# Patient Record
Sex: Female | Born: 2000
Health system: Southern US, Community
[De-identification: ages and names within clinical notes are randomized; demographics above are authoritative.]

## PROBLEM LIST (undated history)

## (undated) DIAGNOSIS — F32A Depression, unspecified: Secondary | ICD-10-CM

## (undated) DIAGNOSIS — F419 Anxiety disorder, unspecified: Secondary | ICD-10-CM

## (undated) DIAGNOSIS — F988 Other specified behavioral and emotional disorders with onset usually occurring in childhood and adolescence: Secondary | ICD-10-CM

## (undated) HISTORY — DX: Anxiety disorder, unspecified: F41.9

---
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
## 2022-02-01 ENCOUNTER — Ambulatory Visit (INDEPENDENT_AMBULATORY_CARE_PROVIDER_SITE_OTHER): Payer: BC Managed Care – PPO | Admitting: Adult Health

## 2022-02-01 ENCOUNTER — Encounter: Payer: Self-pay | Admitting: Adult Health

## 2022-02-01 DIAGNOSIS — F411 Generalized anxiety disorder: Secondary | ICD-10-CM

## 2022-02-01 DIAGNOSIS — F902 Attention-deficit hyperactivity disorder, combined type: Secondary | ICD-10-CM

## 2022-02-01 DIAGNOSIS — F324 Major depressive disorder, single episode, in partial remission: Secondary | ICD-10-CM | POA: Diagnosis not present

## 2022-02-01 MED ORDER — CLONIDINE HCL 0.1 MG PO TABS: 0.1000 mg | ORAL_TABLET | Freq: Every day | ORAL | 2 refills | Status: DC

## 2022-02-01 MED ORDER — VENLAFAXINE HCL ER 75 MG PO CP24: ORAL_CAPSULE | ORAL | 5 refills | Status: DC

## 2022-02-01 NOTE — Progress Notes (Signed)
Jenny Harrell [id number] 22-Jul-2000 21 y.o.  Subjective:   Patient ID:  Jenny Harrell is a 21 y.o. (DOB [birth year]) female.  Chief Complaint: No chief complaint on file.   HPI Kodi B Arruda presents to the office today for follow-up of ADHD, GAD and MDD.  Describes mood today as "ok". Pleasant. Reports tearfulness. Mood symptoms - denies depression, anxiety and irritability. Mood is consistent. Stating "I'm doing ok". Feels like the Effexor at 225mg  continues to work well for. Willing to consider other options for ADD - non stimulant. Stable interest and motivation. Taking medications as prescribed.  Energy levels lower. Active, does not have a regular exercise routine. Enjoys some usual interests and activities. Student. Lives between both parents - 1 - dog and 1- rabbit. Spending time with family and friends. Appetite adequate. Weight stable - 140 pounds. Sleeps well most nights. Averages 6 to 7 hours. Focus and concentration difficulties. Completing tasks. Managing aspects of household. Taking a semester off from [employer]. Works at [employer]. Denies SI or HI.  Denies AH or VH.  Previous medication trials: Denies   Flowsheet Row ED from 02/21/2021 in [facility] from 09/18/2020 in [facility]  C-SSRS RISK CATEGORY No Risk No Risk        Review of Systems:  Review of Systems  Musculoskeletal:  Negative for gait problem.  Neurological:  Negative for tremors.  Psychiatric/Behavioral:         Please refer to HPI    Medications: I have reviewed the patient's current medications.  Current Outpatient Medications  Medication Sig Dispense Refill   amphetamine-dextroamphetamine (ADDERALL XR) 15 MG 24 hr capsule TAKE ONE CAPSULE BY MOUTH EVERY DAY AFTER BREAKFAST 30 capsule 0   ibuprofen (ADVIL) 600 MG tablet Take 1 tablet (600 mg total) by mouth every 6 (six) hours as needed. 30 tablet 0   venlafaxine  XR (EFFEXOR-XR) 75 MG 24 hr capsule Take three capsules every morning. 90 capsule 5   No current facility-administered medications for this visit.    Medication Side Effects: None  Allergies: No Known Allergies  Past Medical History:  Diagnosis Date   ADD (attention deficit disorder)    Depression     Past Medical History, Surgical history, Social history, and Family history were reviewed and updated as appropriate.   Please see review of systems for further details on the patient's review from today.   Objective:   Physical Exam:  There were no vitals taken for this visit.  Physical Exam Constitutional:      General: She is not in acute distress. Musculoskeletal:        General: No deformity.  Neurological:     Mental Status: She is alert and oriented to person, place, and time.     Coordination: Coordination normal.  Psychiatric:        Attention and Perception: Attention and perception normal. She does not perceive auditory or visual hallucinations.        Mood and Affect: Mood normal. Mood is not anxious or depressed. Affect is not labile, blunt, angry or inappropriate.        Speech: Speech normal.        Behavior: Behavior normal.        Thought Content: Thought content normal. Thought content is not paranoid or delusional. Thought content does not include homicidal or suicidal ideation. Thought content does not include homicidal or suicidal plan.  Cognition and Memory: Cognition and memory normal.        Judgment: Judgment normal.     Comments: Insight intact     Lab Review:     Component Value Date/Time   NA 136 05/24/2018 1953   K 3.7 05/24/2018 1953   CL 105 05/24/2018 1953   CO2 25 05/24/2018 1953   GLUCOSE 89 05/24/2018 1953   BUN 14 05/24/2018 1953   CREATININE 0.70 05/24/2018 1953   CALCIUM 9.4 05/24/2018 1953   GFRNONAA NOT CALCULATED 05/24/2018 1953   GFRAA NOT CALCULATED 05/24/2018 1953       Component Value Date/Time   WBC 7.3  05/24/2018 1953   RBC 4.26 05/24/2018 1953   HGB 12.4 05/24/2018 1953   HCT 38.6 05/24/2018 1953   PLT 285 05/24/2018 1953   MCV 90.6 05/24/2018 1953   MCH 29.1 05/24/2018 1953   MCHC 32.1 05/24/2018 1953   RDW 13.0 05/24/2018 1953    No results found for: "POCLITH", "LITHIUM"   No results found for: "PHENYTOIN", "PHENOBARB", "VALPROATE", "CBMZ"   .res Assessment: Plan:    Plan:  PDMP reviewed  Effexor XR 225mg  every morning Add Clonidine 0.1mg  at hs for ADD  Has genetic testing results and will try to provide next visit.   RTC 3 months      Time spent with patient was 25 minutes. Greater than 50% of face to face time with patient was spent on counseling and coordination of care.    Patient advised to contact office with any questions, adverse effects, or acute worsening in signs and symptoms.  Discussed potential benefits, risks, and side effects of stimulants with patient to include increased heart rate, palpitations, insomnia, increased anxiety, increased irritability, or decreased appetite.  Instructed patient to contact office if experiencing any significant tolerability issues.  There are no diagnoses linked to this encounter.   Please see After Visit Summary for patient specific instructions.  No future appointments.  No orders of the defined types were placed in this encounter.   -------------------------------

## 2022-02-02 DIAGNOSIS — Z30013 Encounter for initial prescription of injectable contraceptive: Secondary | ICD-10-CM | POA: Diagnosis not present

## 2022-03-01 ENCOUNTER — Ambulatory Visit: Payer: BC Managed Care – PPO | Admitting: Adult Health

## 2022-03-15 ENCOUNTER — Ambulatory Visit (INDEPENDENT_AMBULATORY_CARE_PROVIDER_SITE_OTHER): Payer: BC Managed Care – PPO | Admitting: Adult Health

## 2022-03-15 ENCOUNTER — Encounter: Payer: Self-pay | Admitting: Adult Health

## 2022-03-15 DIAGNOSIS — F902 Attention-deficit hyperactivity disorder, combined type: Secondary | ICD-10-CM | POA: Diagnosis not present

## 2022-03-15 DIAGNOSIS — F324 Major depressive disorder, single episode, in partial remission: Secondary | ICD-10-CM

## 2022-03-15 DIAGNOSIS — F411 Generalized anxiety disorder: Secondary | ICD-10-CM

## 2022-03-15 DIAGNOSIS — L700 Acne vulgaris: Secondary | ICD-10-CM | POA: Insufficient documentation

## 2022-03-15 MED ORDER — METHYLPHENIDATE HCL 10 MG PO TABS: 10.0000 mg | ORAL_TABLET | Freq: Every day | ORAL | 0 refills | Status: DC

## 2022-03-15 NOTE — Progress Notes (Signed)
Jenny Harrell [id number] [birth year] 20 y.o.  Subjective:   Patient ID:  Jenny Harrell is a 21 y.o. (DOB 14-May-2001) female.  Chief Complaint: No chief complaint on file.   HPI Jenny Harrell presents to the office today for follow-up of ADHD, GAD and MDD.  Describes mood today as "ok". Pleasant. Reports tearfulness. Mood symptoms - denies depression, anxiety and irritability. Mood is consistent. Stating "I'm doing ok". Feels like the Effexor at 225mg  continues to work well for. Did not tolerate the Clonidine and has stopped - "I felt very numb and awkward on it". Willing to consider other options. Stable interest and motivation. Taking medications as prescribed.  Energy levels normal. Active, does not have a regular exercise routine. Enjoys some usual interests and activities. Student. Has a boyfriend. Lives between both parents - 1 - dog and 1- rabbit. Spending time with family and friends. Appetite adequate. Weight stable - 140 pounds. Sleeps well most nights. Averages 6 to 7 hours. Focus and concentration difficulties. Completing tasks. Managing aspects of household. Taking a semester off from [employer]. Works at [employer]. Denies SI or HI.  Denies AH or VH. Denies self harm. Denies substance use.   Previous medication trials: Denies  Flowsheet Row ED from [facility]/21/2021 in [facility] from 09/18/2020 in [facility]  C-SSRS RISK CATEGORY No Risk No Risk        Review of Systems:  Review of Systems  Musculoskeletal:  Negative for gait problem.  Neurological:  Negative for tremors.  Psychiatric/Behavioral:         Please refer to HPI    Medications: I have reviewed the patient's current medications.  Current Outpatient Medications  Medication Sig Dispense Refill   methylphenidate (RITALIN) 10 MG tablet Take 1 tablet (10 mg total) by mouth daily. 30 tablet 0   ibuprofen (ADVIL) 600 MG tablet Take 1  tablet (600 mg total) by mouth every 6 (six) hours as needed. 30 tablet 0   venlafaxine XR (EFFEXOR-XR) 75 MG 24 hr capsule Take three capsules every morning. 90 capsule 5   No current facility-administered medications for this visit.    Medication Side Effects: None  Allergies: No Known Allergies  Past Medical History:  Diagnosis Date   ADD (attention deficit disorder)    Depression     Past Medical History, Surgical history, Social history, and Family history were reviewed and updated as appropriate.   Please see review of systems for further details on the patient's review from today.   Objective:   Physical Exam:  There were no vitals taken for this visit.  Physical Exam Constitutional:      General: She is not in acute distress. Musculoskeletal:        General: No deformity.  Neurological:     Mental Status: She is alert and oriented to person, place, and time.     Coordination: Coordination normal.  Psychiatric:        Attention and Perception: Attention and perception normal. She does not perceive auditory or visual hallucinations.        Mood and Affect: Mood normal. Mood is not anxious or depressed. Affect is not labile, blunt, angry or inappropriate.        Speech: Speech normal.        Behavior: Behavior normal.        Thought Content: Thought content normal. Thought content is not paranoid or delusional. Thought content does not include homicidal  or suicidal ideation. Thought content does not include homicidal or suicidal plan.        Cognition and Memory: Cognition and memory normal.        Judgment: Judgment normal.     Comments: Insight intact     Lab Review:     Component Value Date/Time   NA 136 05/24/2018 1953   K 3.7 05/24/2018 1953   CL 105 05/24/2018 1953   CO2 25 05/24/2018 1953   GLUCOSE 89 05/24/2018 1953   BUN 14 05/24/2018 1953   CREATININE 0.70 05/24/2018 1953   CALCIUM 9.4 05/24/2018 1953   GFRNONAA NOT CALCULATED 05/24/2018 1953    GFRAA NOT CALCULATED 05/24/2018 1953       Component Value Date/Time   WBC 7.3 05/24/2018 1953   RBC 4.26 05/24/2018 1953   HGB 12.4 05/24/2018 1953   HCT 38.6 05/24/2018 1953   PLT 285 05/24/2018 1953   MCV 90.6 05/24/2018 1953   [place] 29.1 05/24/2018 1953   MCHC 32.1 05/24/2018 1953   RDW 13.0 05/24/2018 1953    No results found for: "POCLITH", "LITHIUM"   No results found for: "PHENYTOIN", "PHENOBARB", "VALPROATE", "CBMZ"   .res Assessment: Plan:    Plan:  PDMP reviewed  Effexor XR 225mg  every morning  Add Ritalin 10mg  daily  D/C Clonidine 0.1mg  at hs for ADD  Has genetic testing results on phone  120/85 88  RTC 3 months    Time spent with patient was 25 minutes. Greater than 50% of face to face time with patient was spent on counseling and coordination of care.    Patient advised to contact office with any questions, adverse effects, or acute worsening in signs and symptoms.  Discussed potential benefits, risks, and side effects of stimulants with patient to include increased heart rate, palpitations, insomnia, increased anxiety, increased irritability, or decreased appetite.  Instructed patient to contact office if experiencing any significant tolerability issues.  Diagnoses and all orders for this visit:  Attention deficit hyperactivity disorder (ADHD), combined type, moderate -     methylphenidate (RITALIN) 10 MG tablet; Take 1 tablet (10 mg total) by mouth daily.  GAD (generalized anxiety disorder)  Major depression single episode, in partial remission ([place])     Please see After Visit Summary for patient specific instructions.  Future Appointments  Date Time Provider [place]  04/29/2022  3:00 PM Princess Bruins, MD [facility] None  06/15/2022 11:40 AM Deklin Bieler, Berdie Ogren, NP [facility] None    No orders of the defined types were placed in this encounter.   -------------------------------

## 2022-03-22 DIAGNOSIS — Z0000 Encounter for general adult medical examination without abnormal findings: Secondary | ICD-10-CM | POA: Diagnosis not present

## 2022-03-22 DIAGNOSIS — Z113 Encounter for screening for infections with a predominantly sexual mode of transmission: Secondary | ICD-10-CM | POA: Diagnosis not present

## 2022-03-22 DIAGNOSIS — Z1331 Encounter for screening for depression: Secondary | ICD-10-CM | POA: Diagnosis not present

## 2022-03-22 DIAGNOSIS — Z68.41 Body mass index (BMI) pediatric, 5th percentile to less than 85th percentile for age: Secondary | ICD-10-CM | POA: Diagnosis not present

## 2022-03-22 DIAGNOSIS — Z713 Dietary counseling and surveillance: Secondary | ICD-10-CM | POA: Diagnosis not present

## 2022-04-27 DIAGNOSIS — Z30013 Encounter for initial prescription of injectable contraceptive: Secondary | ICD-10-CM | POA: Diagnosis not present

## 2022-04-29 ENCOUNTER — Encounter: Payer: Self-pay | Admitting: Obstetrics & Gynecology

## 2022-06-15 ENCOUNTER — Encounter: Payer: Self-pay | Admitting: Adult Health

## 2022-06-15 ENCOUNTER — Ambulatory Visit (INDEPENDENT_AMBULATORY_CARE_PROVIDER_SITE_OTHER): Payer: BC Managed Care – PPO | Admitting: Adult Health

## 2022-06-15 DIAGNOSIS — F902 Attention-deficit hyperactivity disorder, combined type: Secondary | ICD-10-CM

## 2022-06-15 DIAGNOSIS — F324 Major depressive disorder, single episode, in partial remission: Secondary | ICD-10-CM | POA: Diagnosis not present

## 2022-06-15 DIAGNOSIS — F411 Generalized anxiety disorder: Secondary | ICD-10-CM

## 2022-06-15 MED ORDER — METHYLPHENIDATE HCL 10 MG PO TABS: 10.0000 mg | ORAL_TABLET | Freq: Every day | ORAL | 0 refills | Status: DC

## 2022-06-15 NOTE — Progress Notes (Signed)
Jenny Harrell [id number] Oct 16, 2000 22 y.o.  Subjective:   Patient ID:  Jenny Harrell is a 22 y.o. (DOB 01-12-2001) female.  Chief Complaint: No chief complaint on file.   HPI Jenny Harrell presents to the office today for follow-up of ADHD, GAD and MDD.  Describes mood today as "ok". Pleasant. Reports tearfulness. Mood symptoms - decreased depression, anxiety and irritability. Mood is consistent. Stating "I'm doing alright". Feels like current medication regimen works well. Stable interest and motivation. Taking medications as prescribed.  Energy levels normal. Active, does not have a regular exercise routine. Enjoys some usual interests and activities. Student. Has a boyfriend. Lives between both parents - 1 - dog and 1- rabbit. Spending time with family and friends. Appetite adequate. Weight stable - 140 pounds. Sleeps well most nights. Averages 6 to 7 hours. Focus and concentration "better" with addition of Ritalin. Completing tasks. Managing aspects of household. Taking a semester off from [employer]. Works at [employer]. Denies SI or HI.  Denies AH or VH. Denies self harm. Denies substance use.   Previous medication trials: Denies   [place] ED from 02/21/2021 in [facility] Department at [facility] ED from 09/18/2020 in [facility] Department at [place] No Risk No Risk        Review of Systems:  Review of Systems  Musculoskeletal:  Negative for gait problem.  Neurological:  Negative for tremors.  Psychiatric/Behavioral:         Please refer to HPI    Medications: I have reviewed the patient's current medications.  Current Outpatient Medications  Medication Sig Dispense Refill   ibuprofen (ADVIL) 600 MG tablet Take 1 tablet (600 mg total) by mouth every 6 (six) hours as needed. 30 tablet 0   methylphenidate (RITALIN) 10 MG tablet Take 1 tablet (10 mg total) by mouth daily. 30 tablet  0   venlafaxine XR (EFFEXOR-XR) 75 MG 24 hr capsule Take three capsules every morning. 90 capsule 5   No current facility-administered medications for this visit.    Medication Side Effects: None  Allergies: No Known Allergies  Past Medical History:  Diagnosis Date   ADD (attention deficit disorder)    Depression     Past Medical History, Surgical history, Social history, and Family history were reviewed and updated as appropriate.   Please see review of systems for further details on the patient's review from today.   Objective:   Physical Exam:  There were no vitals taken for this visit.  Physical Exam Constitutional:      General: She is not in acute distress. Musculoskeletal:        General: No deformity.  Neurological:     Mental Status: She is alert and oriented to person, place, and time.     Coordination: Coordination normal.  Psychiatric:        Attention and Perception: Attention and perception normal. She does not perceive auditory or visual hallucinations.        Mood and Affect: Mood normal. Mood is not anxious or depressed. Affect is not labile, blunt, angry or inappropriate.        Speech: Speech normal.        Behavior: Behavior normal.        Thought Content: Thought content normal. Thought content is not paranoid or delusional. Thought content does not include homicidal or suicidal ideation. Thought content does not include homicidal or suicidal plan.  Cognition and Memory: Cognition and memory normal.        Judgment: Judgment normal.     Comments: Insight intact     Lab Review:     Component Value Date/Time   NA 136 05/24/2018 1953   K 3.7 05/24/2018 1953   CL 105 05/24/2018 1953   CO2 25 05/24/2018 1953   GLUCOSE 89 05/24/2018 1953   BUN 14 05/24/2018 1953   CREATININE 0.70 05/24/2018 1953   CALCIUM 9.4 05/24/2018 1953   GFRNONAA NOT CALCULATED 05/24/2018 1953   GFRAA NOT CALCULATED 05/24/2018 1953       Component Value Date/Time    WBC 7.3 05/24/2018 1953   RBC 4.26 05/24/2018 1953   HGB 12.4 05/24/2018 1953   HCT 38.6 05/24/2018 1953   PLT 285 05/24/2018 1953   MCV 90.6 05/24/2018 1953   [place] 29.1 05/24/2018 1953   MCHC 32.1 05/24/2018 1953   RDW 13.0 05/24/2018 1953    No results found for: "POCLITH", "LITHIUM"   No results found for: "PHENYTOIN", "PHENOBARB", "VALPROATE", "CBMZ"   .res Assessment: Plan:    Plan:  PDMP reviewed  Effexor XR 225mg  every morning Ritalin 10mg  daily  Has genetic testing results on phone  120/85 88  RTC 3 months    Time spent with patient was 25 minutes. Greater than 50% of face to face time with patient was spent on counseling and coordination of care.    Patient advised to contact office with any questions, adverse effects, or acute worsening in signs and symptoms.  Discussed potential benefits, risks, and side effects of stimulants with patient to include increased heart rate, palpitations, insomnia, increased anxiety, increased irritability, or decreased appetite.  Instructed patient to contact office if experiencing any significant tolerability issues.  Diagnoses and all orders for this visit:  Attention deficit hyperactivity disorder (ADHD), combined type, moderate -     methylphenidate (RITALIN) 10 MG tablet; Take 1 tablet (10 mg total) by mouth daily.     Please see After Visit Summary for patient specific instructions.  Future Appointments  Date Time Provider [place]  06/17/2022 11:00 AM Princess Bruins, MD [facility] None    No orders of the defined types were placed in this encounter.   -------------------------------

## 2022-06-16 DIAGNOSIS — M545 Low back pain, unspecified: Secondary | ICD-10-CM | POA: Diagnosis not present

## 2022-06-17 ENCOUNTER — Encounter: Payer: Self-pay | Admitting: Obstetrics & Gynecology

## 2022-06-17 ENCOUNTER — Ambulatory Visit (INDEPENDENT_AMBULATORY_CARE_PROVIDER_SITE_OTHER): Payer: BC Managed Care – PPO | Admitting: Obstetrics & Gynecology

## 2022-06-17 VITALS — BP 116/78 | HR 85 | Ht 65.25 in | Wt 137.0 lb

## 2022-06-17 DIAGNOSIS — Z3042 Encounter for surveillance of injectable contraceptive: Secondary | ICD-10-CM

## 2022-06-17 DIAGNOSIS — Z01419 Encounter for gynecological examination (general) (routine) without abnormal findings: Secondary | ICD-10-CM | POA: Diagnosis not present

## 2022-06-17 DIAGNOSIS — Z113 Encounter for screening for infections with a predominantly sexual mode of transmission: Secondary | ICD-10-CM | POA: Diagnosis not present

## 2022-06-17 NOTE — Progress Notes (Signed)
Jenny Harrell [birth year] [id number]   History:    22 y.o. G0 Boyfriend  RP:  New patient presenting for annual gyn exam   HPI: Well on DepoProvera x 3 years.  Next dose due 07/2022.  No BTB.  No pelvic pain.  No pain with IC.  No H/O STI.  Gono-Chlam done today.  HPV vaccine received already.  Breasts normal.  BMI 22.62.    Past medical history,surgical history, family history and social history were all reviewed and documented in the EPIC chart.  Gynecologic History No LMP recorded. Patient has had an injection.  Obstetric History OB History  Gravida Para Term Preterm AB Living  0 0 0 0 0 0  SAB IAB Ectopic Multiple Live Births  0 0 0 0 0     ROS: A ROS was performed and pertinent positives and negatives are included in the history. GENERAL: No fevers or chills. HEENT: No change in vision, no earache, sore throat or sinus congestion. NECK: No pain or stiffness. CARDIOVASCULAR: No chest pain or pressure. No palpitations. PULMONARY: No shortness of breath, cough or wheeze. GASTROINTESTINAL: No abdominal pain, nausea, vomiting or diarrhea, melena or bright red blood per rectum. GENITOURINARY: No urinary frequency, urgency, hesitancy or dysuria. MUSCULOSKELETAL: No joint or muscle pain, no back pain, no recent trauma. DERMATOLOGIC: No rash, no itching, no lesions. ENDOCRINE: No polyuria, polydipsia, no heat or cold intolerance. No recent change in weight. HEMATOLOGICAL: No anemia or easy bruising or bleeding. NEUROLOGIC: No headache, seizures, numbness, tingling or weakness. PSYCHIATRIC: No depression, no loss of interest in normal activity or change in sleep pattern.     Exam:   BP 116/78   Pulse 85   Ht 5' 5.25" (1.657 m)   Wt 137 lb (62.1 kg)   SpO2 97%   BMI 22.62 kg/m   Body mass index is 22.62 kg/m.  General appearance : Well developed well nourished female. No acute distress HEENT: Eyes: no retinal hemorrhage or exudates,  Neck supple, trachea midline, no carotid  bruits, no thyroidmegaly Lungs: Clear to auscultation, no rhonchi or wheezes, or rib retractions  Heart: Regular rate and rhythm, no murmurs or gallops Breast:Examined in sitting and supine position were symmetrical in appearance, no palpable masses or tenderness,  no skin retraction, no nipple inversion, no nipple discharge, no skin discoloration, no axillary or supraclavicular lymphadenopathy Abdomen: no palpable masses or tenderness, no rebound or guarding Extremities: no edema or skin discoloration or tenderness  Pelvic: Vulva: Normal             Vagina: No gross lesions or discharge  Cervix: No gross lesions or discharge.  Gono-Chlam done.  Uterus  AV, normal size, shape and consistency, non-tender and mobile  Adnexa  Without masses or tenderness  Anus: Normal   Assessment/Plan:  22 y.o. female for annual exam   1. Well female exam with routine gynecological exam Well on DepoProvera x 3 years.  Next dose due 07/2022.  No BTB.  No pelvic pain.  No pain with IC.  No H/O STI.  Gono-Chlam done today.  HPV vaccine received already.  Breasts normal.  BMI 22.62.    2. Screen for STD (sexually transmitted disease) Declines other STI screen. - C. trachomatis/N. gonorrhoeae RNA  3. Encounter for surveillance of injectable contraceptive Well on DepoProvera x 3 years.  Next dose at [facility] due in 07/2022.  Other orders - medroxyPROGESTERone (DEPO-PROVERA) 150 MG/ML injection; Inject into the muscle as directed.  Princess Bruins MD, 11:18 AM

## 2022-06-18 DIAGNOSIS — H3589 Other specified retinal disorders: Secondary | ICD-10-CM | POA: Diagnosis not present

## 2022-06-18 LAB — C. TRACHOMATIS/N. GONORRHOEAE RNA
C. trachomatis RNA, TMA: NOT DETECTED
N. gonorrhoeae RNA, TMA: NOT DETECTED

## 2022-06-28 DIAGNOSIS — M545 Low back pain, unspecified: Secondary | ICD-10-CM | POA: Diagnosis not present

## 2022-07-05 DIAGNOSIS — M545 Low back pain, unspecified: Secondary | ICD-10-CM | POA: Diagnosis not present

## 2022-07-12 DIAGNOSIS — M545 Low back pain, unspecified: Secondary | ICD-10-CM | POA: Diagnosis not present

## 2022-07-20 DIAGNOSIS — M545 Low back pain, unspecified: Secondary | ICD-10-CM | POA: Diagnosis not present

## 2022-09-14 ENCOUNTER — Encounter: Payer: Self-pay | Admitting: Adult Health

## 2022-09-14 ENCOUNTER — Ambulatory Visit (INDEPENDENT_AMBULATORY_CARE_PROVIDER_SITE_OTHER): Payer: BC Managed Care – PPO | Admitting: Adult Health

## 2022-09-14 DIAGNOSIS — F411 Generalized anxiety disorder: Secondary | ICD-10-CM

## 2022-09-14 DIAGNOSIS — F902 Attention-deficit hyperactivity disorder, combined type: Secondary | ICD-10-CM | POA: Diagnosis not present

## 2022-09-14 DIAGNOSIS — F324 Major depressive disorder, single episode, in partial remission: Secondary | ICD-10-CM

## 2022-09-14 MED ORDER — METHYLPHENIDATE HCL 10 MG PO TABS: 10.0000 mg | ORAL_TABLET | Freq: Every day | ORAL | 0 refills | Status: DC

## 2022-09-14 MED ORDER — VENLAFAXINE HCL ER 75 MG PO CP24: ORAL_CAPSULE | ORAL | 5 refills | Status: DC

## 2022-09-14 NOTE — Progress Notes (Signed)
Jenny Harrell [id number] [birth year] 22 y.o.  Subjective:   Patient ID:  Jenny Harrell is a 22 y.o. (DOB Jun 16, 2000) female.  Chief Complaint: No chief complaint on file.   HPI Jenny Harrell presents to the office today for follow-up of ADHD, GAD and MDD.  Describes mood today as "ok". Pleasant. Reports tearfulness. Mood symptoms - denies depression, anxiety and irritability. Denies worry, rumination, and over thinking. Mood is consistent. Stating "I'm doing good". Feels like current medication regimen works well. Stable interest and motivation. Taking medications as prescribed. . Energy levels stable. Active, does not have a regular exercise routine. Enjoys some usual interests and activities. Student. Has a boyfriend. Lives between both parents - 1 - dog and 1- rabbit. Spending time with family and friends. Appetite adequate. Weight stable - 140 pounds. Sleeps well most nights. Averages 6 to 7 hours. Focus and concentration stable with Ritalin. Completing tasks. Managing aspects of household. Returning to [employer] in the fall. Works at [employer]. Denies SI or HI.  Denies AH or VH. Denies self harm. Denies substance use.   Previous medication trials: Denies     Flowsheet Row ED from 02/21/2021 in [facility] Emergency Department at [facility] ED from 09/18/2020 in [facility] Emergency Department at [facility]  C-SSRS RISK CATEGORY No Risk No Risk        Review of Systems:  Review of Systems  Musculoskeletal:  Negative for gait problem.  Neurological:  Negative for tremors.  Psychiatric/Behavioral:         Please refer to HPI    Medications: I have reviewed the patient's current medications.  Current Outpatient Medications  Medication Sig Dispense Refill   [START ON 10/12/2022] methylphenidate (RITALIN) 10 MG tablet Take 1 tablet (10 mg total) by mouth daily. 30 tablet 0   ibuprofen (ADVIL) 600 MG tablet Take 1 tablet (600 mg total)  by mouth every 6 (six) hours as needed. 30 tablet 0   medroxyPROGESTERone (DEPO-PROVERA) 150 MG/ML injection Inject into the muscle as directed.     methylphenidate (RITALIN) 10 MG tablet Take 1 tablet (10 mg total) by mouth daily. 30 tablet 0   venlafaxine XR (EFFEXOR-XR) 75 MG 24 hr capsule Take three capsules every morning. 90 capsule 5   No current facility-administered medications for this visit.    Medication Side Effects: None  Allergies: No Known Allergies  Past Medical History:  Diagnosis Date   ADD (attention deficit disorder)    Anxiety    Depression     Past Medical History, Surgical history, Social history, and Family history were reviewed and updated as appropriate.   Please see review of systems for further details on the patient's review from today.   Objective:   Physical Exam:  There were no vitals taken for this visit.  Physical Exam Constitutional:      General: She is not in acute distress. Musculoskeletal:        General: No deformity.  Neurological:     Mental Status: She is alert and oriented to person, place, and time.     Coordination: Coordination normal.  Psychiatric:        Attention and Perception: Attention and perception normal. She does not perceive auditory or visual hallucinations.        Mood and Affect: Mood normal. Mood is not anxious or depressed. Affect is not labile, blunt, angry or inappropriate.        Speech: Speech normal.  Behavior: Behavior normal.        Thought Content: Thought content normal. Thought content is not paranoid or delusional. Thought content does not include homicidal or suicidal ideation. Thought content does not include homicidal or suicidal plan.        Cognition and Memory: Cognition and memory normal.        Judgment: Judgment normal.     Comments: Insight intact     Lab Review:     Component Value Date/Time   NA 136 05/24/2018 1953   K 3.7 05/24/2018 1953   CL 105 05/24/2018 1953   CO2 25  05/24/2018 1953   GLUCOSE 89 05/24/2018 1953   BUN 14 05/24/2018 1953   CREATININE 0.70 05/24/2018 1953   CALCIUM 9.4 05/24/2018 1953   GFRNONAA NOT CALCULATED 05/24/2018 1953   GFRAA NOT CALCULATED 05/24/2018 1953       Component Value Date/Time   WBC 7.3 05/24/2018 1953   RBC 4.26 05/24/2018 1953   HGB 12.4 05/24/2018 1953   HCT 38.6 05/24/2018 1953   PLT 285 05/24/2018 1953   MCV 90.6 05/24/2018 1953   MCH 29.1 05/24/2018 1953   MCHC 32.1 05/24/2018 1953   RDW 13.0 05/24/2018 1953    No results found for: "POCLITH", "LITHIUM"   No results found for: "PHENYTOIN", "PHENOBARB", "VALPROATE", "CBMZ"   .res Assessment: Plan:    Plan:  PDMP reviewed  Effexor XR  every morning Ritalin  daily   Has genetic testing results on phone.  Monitor BP between visits while taking stimulant medication.   RTC 3 months    Time spent with patient was 25 minutes. Greater than 50% of face to face time with patient was spent on counseling and coordination of care.    Patient advised to contact office with any questions, adverse effects, or acute worsening in signs and symptoms.  Discussed potential benefits, risks, and side effects of stimulants with patient to include increased heart rate, palpitations, insomnia, increased anxiety, increased irritability, or decreased appetite.  Instructed patient to contact office if experiencing any significant tolerability issues.  Diagnoses and all orders for this visit:  Major depression single episode, in partial remission  GAD (generalized anxiety disorder) -     venlafaxine XR (EFFEXOR-XR) 75 MG 24 hr capsule; Take three capsules every morning.  Attention deficit hyperactivity disorder (ADHD), combined type, moderate -     methylphenidate (RITALIN) 10 MG tablet; Take 1 tablet (10 mg total) by mouth daily. -     methylphenidate (RITALIN) 10 MG tablet; Take 1 tablet (10 mg total) by mouth daily.     Please see After Visit  Summary for patient specific instructions.  Future Appointments  Date Time Provider Department Center  12/14/2022 10:00 AM Zanita Millman, Thereasa Solo, NP CP-CP None    No orders of the defined types were placed in this encounter.   -------------------------------

## 2022-11-09 ENCOUNTER — Telehealth: Payer: Self-pay | Admitting: Adult Health

## 2022-11-09 NOTE — Telephone Encounter (Signed)
No message

## 2022-11-10 NOTE — Telephone Encounter (Signed)
Next visit is 12/14/22. Jenny Harrell re-enrolling at [employer] on 8/20. She is requesting a letter that her provider is confident that she will be able to hand the stressors of college. She wants to pick it up when ready. Phone number is [phone].

## 2022-11-17 ENCOUNTER — Encounter: Payer: Self-pay | Admitting: Adult Health

## 2022-11-17 ENCOUNTER — Ambulatory Visit (INDEPENDENT_AMBULATORY_CARE_PROVIDER_SITE_OTHER): Payer: BC Managed Care – PPO | Admitting: Adult Health

## 2022-11-17 DIAGNOSIS — F902 Attention-deficit hyperactivity disorder, combined type: Secondary | ICD-10-CM | POA: Diagnosis not present

## 2022-11-17 DIAGNOSIS — F411 Generalized anxiety disorder: Secondary | ICD-10-CM

## 2022-11-17 DIAGNOSIS — F324 Major depressive disorder, single episode, in partial remission: Secondary | ICD-10-CM | POA: Diagnosis not present

## 2022-11-17 NOTE — Progress Notes (Signed)
Jenny Harrell [id number] [birth year] 21 y.o.  Subjective:   Patient ID:  Jenny Harrell is a 22 y.o. (DOB [birth year]) female.  Chief Complaint: No chief complaint on file.   HPI Jenny Harrell presents to the office today for follow-up of ADHD, GAD and MDD.  Describes mood today as "ok". Pleasant. Reports tearfulness. Mood symptoms - denies depression, anxiety and irritability. Denies worry, rumination, and over thinking. Mood is consistent. Stating "I feel like I'm doing ok". Feels like current medication regimen works well. Stable interest and motivation. Taking medications as prescribed. . Energy levels stable. Active, does not have a regular exercise routine. Enjoys some usual interests and activities. Student. Has a boyfriend. Lives between both parents - 1 - dog and 1- rabbit. Spending time with family and friends. Appetite adequate. Weight stable - 140 pounds. Sleeps well most nights. Averages 6 to 7 hours. Focus and concentration stable with Ritalin. Completing tasks. Managing aspects of household. Returning to [employer] in the fall. Works at [employer]. Denies SI or HI.  Denies AH or VH. Denies self harm. Denies substance use.   Previous medication trials: Denies     Flowsheet Row ED from 02/21/2021 in [facility] Emergency Department at [facility] ED from 09/18/2020 in [facility] Emergency Department at [facility]  C-SSRS RISK CATEGORY No Risk No Risk        Review of Systems:  Review of Systems  Musculoskeletal:  Negative for gait problem.  Neurological:  Negative for tremors.  Psychiatric/Behavioral:         Please refer to HPI    Medications: I have reviewed the patient's current medications.  Current Outpatient Medications  Medication Sig Dispense Refill   ibuprofen (ADVIL) 600 MG tablet Take 1 tablet (600 mg total) by mouth every 6 (six) hours as needed. 30 tablet 0   medroxyPROGESTERone (DEPO-PROVERA) 150 MG/ML injection  Inject into the muscle as directed.     methylphenidate (RITALIN) 10 MG tablet Take 1 tablet (10 mg total) by mouth daily. 30 tablet 0   methylphenidate (RITALIN) 10 MG tablet Take 1 tablet (10 mg total) by mouth daily. 30 tablet 0   venlafaxine XR (EFFEXOR-XR) 75 MG 24 hr capsule Take three capsules every morning. 90 capsule 5   No current facility-administered medications for this visit.    Medication Side Effects: None  Allergies: No Known Allergies  Past Medical History:  Diagnosis Date   ADD (attention deficit disorder)    Anxiety    Depression     Past Medical History, Surgical history, Social history, and Family history were reviewed and updated as appropriate.   Please see review of systems for further details on the patient's review from today.   Objective:   Physical Exam:  There were no vitals taken for this visit.  Physical Exam Constitutional:      General: She is not in acute distress. Musculoskeletal:        General: No deformity.  Neurological:     Mental Status: She is alert and oriented to person, place, and time.     Coordination: Coordination normal.  Psychiatric:        Attention and Perception: Attention and perception normal. She does not perceive auditory or visual hallucinations.        Mood and Affect: Mood normal. Mood is not anxious or depressed. Affect is not labile, blunt, angry or inappropriate.        Speech: Speech normal.  Behavior: Behavior normal.        Thought Content: Thought content normal. Thought content is not paranoid or delusional. Thought content does not include homicidal or suicidal ideation. Thought content does not include homicidal or suicidal plan.        Cognition and Memory: Cognition and memory normal.        Judgment: Judgment normal.     Comments: Insight intact     Lab Review:     Component Value Date/Time   NA 136 05/24/2018 1953   K 3.7 05/24/2018 1953   CL 105 05/24/2018 1953   CO2 25 05/24/2018  1953   GLUCOSE 89 05/24/2018 1953   BUN 14 05/24/2018 1953   CREATININE 0.70 05/24/2018 1953   CALCIUM 9.4 05/24/2018 1953   GFRNONAA NOT CALCULATED 05/24/2018 1953   GFRAA NOT CALCULATED 05/24/2018 1953       Component Value Date/Time   WBC 7.3 05/24/2018 1953   RBC 4.26 05/24/2018 1953   HGB 12.4 05/24/2018 1953   HCT 38.6 05/24/2018 1953   PLT 285 05/24/2018 1953   MCV 90.6 05/24/2018 1953   MCH 29.1 05/24/2018 1953   MCHC 32.1 05/24/2018 1953   RDW 13.0 05/24/2018 1953    No results found for: "POCLITH", "LITHIUM"   No results found for: "PHENYTOIN", "PHENOBARB", "VALPROATE", "CBMZ"   .res Assessment: Plan:    Plan:  PDMP reviewed  Effexor XR 225mg  every morning Ritalin 10mg  daily   Has genetic testing results on phone.  Monitor BP between visits while taking stimulant medication.   RTC 3 months    Time spent with patient was 25 minutes. Greater than 50% of face to face time with patient was spent on counseling and coordination of care.    Patient advised to contact office with any questions, adverse effects, or acute worsening in signs and symptoms.  Discussed potential benefits, risks, and side effects of stimulants with patient to include increased heart rate, palpitations, insomnia, increased anxiety, increased irritability, or decreased appetite.  Instructed patient to contact office if experiencing any significant tolerability issues.  Diagnoses and all orders for this visit:  GAD (generalized anxiety disorder)  Attention deficit hyperactivity disorder (ADHD), combined type, moderate  Major depression single episode, in partial remission [facility])     Please see After Visit Summary for patient specific instructions.  No future appointments.   No orders of the defined types were placed in this encounter.   -------------------------------

## 2022-12-14 ENCOUNTER — Ambulatory Visit: Payer: BC Managed Care – PPO | Admitting: Adult Health

## 2023-02-17 ENCOUNTER — Ambulatory Visit: Payer: BC Managed Care – PPO | Admitting: Adult Health

## 2023-02-28 ENCOUNTER — Ambulatory Visit: Payer: BC Managed Care – PPO | Admitting: Adult Health

## 2023-03-01 DIAGNOSIS — N3001 Acute cystitis with hematuria: Secondary | ICD-10-CM | POA: Diagnosis not present

## 2023-05-23 ENCOUNTER — Other Ambulatory Visit: Payer: Self-pay | Admitting: Adult Health

## 2023-05-23 DIAGNOSIS — F411 Generalized anxiety disorder: Secondary | ICD-10-CM

## 2023-05-23 NOTE — Telephone Encounter (Signed)
Please schedule pt an appt lv 06/26- lv 10/7 was canceled by provider

## 2023-07-01 NOTE — Telephone Encounter (Signed)
 Appt set 07/06/23

## 2023-07-06 ENCOUNTER — Ambulatory Visit: Payer: BC Managed Care – PPO | Admitting: Adult Health

## 2023-07-06 ENCOUNTER — Encounter: Payer: Self-pay | Admitting: Adult Health

## 2023-07-06 DIAGNOSIS — F411 Generalized anxiety disorder: Secondary | ICD-10-CM

## 2023-07-06 DIAGNOSIS — F902 Attention-deficit hyperactivity disorder, combined type: Secondary | ICD-10-CM

## 2023-07-06 DIAGNOSIS — F324 Major depressive disorder, single episode, in partial remission: Secondary | ICD-10-CM

## 2023-07-06 MED ORDER — VENLAFAXINE HCL ER 75 MG PO CP24: ORAL_CAPSULE | ORAL | 5 refills | Status: DC

## 2023-07-06 NOTE — Progress Notes (Signed)
Jenny Harrell [id number] 12-06-2000 23 y.o.  Subjective:   Patient ID:  Jenny Harrell is a 23 y.o. (DOB April 09, 2001) female.  Chief Complaint: No chief complaint on file.   HPI Jenny Harrell presents to the office today for follow-up of ADHD, GAD and MDD.  Describes mood today as "ok". Pleasant. Reports tearfulness. Mood symptoms - reports "some" depression, anxiety and irritability. Reports stable interest and motivation. Denies panic attacks. Denies worry, rumination, and over thinking. Mood is consistent. Stating "I feel like I'm doing alright". Feels like current medication regimen works well. Taking medications as prescribed.  Energy levels stable. Active, does not have a regular exercise routine. Enjoys some usual interests and activities. Student. Has a boyfriend. Lives between both parents - 1 - dog and 1- rabbit. Spending time with family and friends. Appetite adequate. Weight gain - 140 to 150 pounds. Sleeps well most nights. Averages 6 to 8 hours. Focus and concentration stable. Completing tasks. Managing aspects of household. Returning to [employer] in the fall. Works at [employer]. Denies SI or HI.  Denies AH or VH. Denies self harm. Denies substance use.   Previous medication trials: Denies   Flowsheet Row ED from 02/21/2021 in [facility] Emergency Department at [facility] ED from 09/18/2020 in [facility] Emergency Department at [facility]  C-SSRS RISK CATEGORY No Risk No Risk        Review of Systems:  Review of Systems  Musculoskeletal:  Negative for gait problem.  Neurological:  Negative for tremors.  Psychiatric/Behavioral:         Please refer to HPI    Medications: I have reviewed the patient's current medications.  Current Outpatient Medications  Medication Sig Dispense Refill   ibuprofen (ADVIL) 600 MG tablet Take 1 tablet (600 mg total) by mouth every 6 (six) hours as needed. 30 tablet 0   medroxyPROGESTERone  (DEPO-PROVERA) 150 MG/ML injection Inject into the muscle as directed.     methylphenidate (RITALIN) 10 MG tablet Take 1 tablet (10 mg total) by mouth daily. 30 tablet 0   methylphenidate (RITALIN) 10 MG tablet Take 1 tablet (10 mg total) by mouth daily. 30 tablet 0   venlafaxine XR (EFFEXOR-XR) 75 MG 24 hr capsule TAKE THREE CAPSULES BY MOUTH EVERY MORNING 90 capsule 0   No current facility-administered medications for this visit.    Medication Side Effects: None  Allergies: No Known Allergies  Past Medical History:  Diagnosis Date   ADD (attention deficit disorder)    Anxiety    Depression     Past Medical History, Surgical history, Social history, and Family history were reviewed and updated as appropriate.   Please see review of systems for further details on the patient's review from today.   Objective:   Physical Exam:  There were no vitals taken for this visit.  Physical Exam Constitutional:      General: She is not in acute distress. Musculoskeletal:        General: No deformity.  Neurological:     Mental Status: She is alert and oriented to person, place, and time.     Coordination: Coordination normal.  Psychiatric:        Attention and Perception: Attention and perception normal. She does not perceive auditory or visual hallucinations.        Mood and Affect: Mood normal. Mood is not anxious or depressed. Affect is not labile, blunt, angry or inappropriate.        Speech:  Speech normal.        Behavior: Behavior normal.        Thought Content: Thought content normal. Thought content is not paranoid or delusional. Thought content does not include homicidal or suicidal ideation. Thought content does not include homicidal or suicidal plan.        Cognition and Memory: Cognition and memory normal.        Judgment: Judgment normal.     Comments: Insight intact     Lab Review:     Component Value Date/Time   NA 136 05/24/2018 1953   K 3.7 05/24/2018 1953   CL  105 05/24/2018 1953   CO2 25 05/24/2018 1953   GLUCOSE 89 05/24/2018 1953   BUN 14 05/24/2018 1953   CREATININE 0.70 05/24/2018 1953   CALCIUM 9.4 05/24/2018 1953   GFRNONAA NOT CALCULATED 05/24/2018 1953   GFRAA NOT CALCULATED 05/24/2018 1953       Component Value Date/Time   WBC 7.3 05/24/2018 1953   RBC 4.26 05/24/2018 1953   HGB 12.4 05/24/2018 1953   HCT 38.6 05/24/2018 1953   PLT 285 05/24/2018 1953   MCV 90.6 05/24/2018 1953   MCH 29.1 05/24/2018 1953   MCHC 32.1 05/24/2018 1953   RDW 13.0 05/24/2018 1953    No results found for: "POCLITH", "LITHIUM"   No results found for: "PHENYTOIN", "PHENOBARB", "VALPROATE", "CBMZ"   .res Assessment: Plan:    Plan:  PDMP reviewed  Effexor XR 225mg  every morning  RTC 3 months    Patient advised to contact office with any questions, adverse effects, or acute worsening in signs and symptoms.  Discussed potential benefits, risks, and side effects of stimulants with patient to include increased heart rate, palpitations, insomnia, increased anxiety, increased irritability, or decreased appetite.  Instructed patient to contact office if experiencing any significant tolerability issues.  There are no diagnoses linked to this encounter.   Please see After Visit Summary for patient specific instructions.  Future Appointments  Date Time Provider Department Center  07/06/2023 10:30 AM Braidyn Peace, Thereasa Solo, NP CP-CP None    No orders of the defined types were placed in this encounter.   -------------------------------
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
## 2024-01-09 ENCOUNTER — Ambulatory Visit (INDEPENDENT_AMBULATORY_CARE_PROVIDER_SITE_OTHER): Payer: BC Managed Care – PPO | Admitting: Adult Health

## 2024-01-09 ENCOUNTER — Encounter: Payer: Self-pay | Admitting: Adult Health

## 2024-01-09 DIAGNOSIS — Z0389 Encounter for observation for other suspected diseases and conditions ruled out: Secondary | ICD-10-CM

## 2024-01-09 NOTE — Progress Notes (Signed)
 Patient no show appointment. ? ?

## 2024-01-11 ENCOUNTER — Telehealth: Payer: Self-pay | Admitting: Adult Health

## 2024-01-11 NOTE — Telephone Encounter (Signed)
 Pt stopped Adderall and wants to start back now that school is back in session     [facility] on [address]

## 2024-01-11 NOTE — Telephone Encounter (Signed)
 Has not been seen since Feb and was an no show for appt earlier this week. Sent MyChart message that she needs to schedule appt for RF.

## 2024-01-24 ENCOUNTER — Telehealth: Payer: Self-pay | Admitting: Adult Health

## 2024-01-24 DIAGNOSIS — F324 Major depressive disorder, single episode, in partial remission: Secondary | ICD-10-CM

## 2024-01-24 DIAGNOSIS — F411 Generalized anxiety disorder: Secondary | ICD-10-CM

## 2024-01-24 DIAGNOSIS — F902 Attention-deficit hyperactivity disorder, combined type: Secondary | ICD-10-CM

## 2024-01-24 MED ORDER — VENLAFAXINE HCL ER 75 MG PO CP24: ORAL_CAPSULE | ORAL | 0 refills | Status: DC

## 2024-01-24 NOTE — Telephone Encounter (Signed)
 Confirmed with patient that she needs Ritalin , not Adderall. She also reports that she called twice today requesting a RF on venlafaxine . I don't see a request, but will send in.

## 2024-01-24 NOTE — Telephone Encounter (Signed)
 Pt is not prescribed Adderall, is prescribed Ritalin  10 mg. Last filled 09/2022.

## 2024-01-24 NOTE — Telephone Encounter (Signed)
 In prev msg it stated she wanted Adderall since school was back in.

## 2024-01-24 NOTE — Telephone Encounter (Signed)
 Pt has appt 9/12     Needs refill on Adderall

## 2024-01-24 NOTE — Telephone Encounter (Signed)
Pt has appt 9/12

## 2024-01-25 MED ORDER — VENLAFAXINE HCL ER 75 MG PO CP24: ORAL_CAPSULE | ORAL | 0 refills | Status: DC

## 2024-01-25 NOTE — Addendum Note (Signed)
 Addended by: CON BRISTLE T on: 01/25/2024 09:07 AM   Modules accepted: Orders

## 2024-01-25 NOTE — Telephone Encounter (Signed)
 Rx for venlafaxine  had been sent to [facility]. I received a fax from [facility] on [address] requesting a RF. Asked patient what pharmacy it should go to and she said [facility]. Resent RF to [facility]. Told her that it had been over a year since she had been on a stimulant and that she would need to discuss with Tillman at her appt on 9/12.

## 2024-01-25 NOTE — Telephone Encounter (Signed)
Ok for telehealth visit?

## 2024-01-25 NOTE — Telephone Encounter (Signed)
 Addendum to attached message. Jenny Harrell just made an appt on 12/03/23 at 2:30 pm. She made it for a phone visit. Does she need to come in person?

## 2024-02-03 ENCOUNTER — Encounter: Payer: Self-pay | Admitting: Adult Health

## 2024-02-03 ENCOUNTER — Ambulatory Visit: Admitting: Adult Health

## 2024-02-03 DIAGNOSIS — F324 Major depressive disorder, single episode, in partial remission: Secondary | ICD-10-CM

## 2024-02-03 DIAGNOSIS — F902 Attention-deficit hyperactivity disorder, combined type: Secondary | ICD-10-CM

## 2024-02-03 DIAGNOSIS — F411 Generalized anxiety disorder: Secondary | ICD-10-CM

## 2024-02-03 MED ORDER — METHYLPHENIDATE HCL 10 MG PO TABS: 10.0000 mg | ORAL_TABLET | Freq: Every day | ORAL | 0 refills | Status: DC

## 2024-02-03 MED ORDER — VENLAFAXINE HCL ER 75 MG PO CP24: ORAL_CAPSULE | ORAL | 2 refills | Status: DC

## 2024-02-03 NOTE — Progress Notes (Signed)
 Jenny Harrell [id number] 23-Mar-2001 22 y.o.  Virtual Visit via Telephone Note  I connected with pt on 02/03/24 at  2:30 PM EDT by telephone and verified that I am speaking with the correct person using two identifiers.   I discussed the limitations, risks, security and privacy concerns of performing an evaluation and management service by telephone and the availability of in person appointments. I also discussed with the patient that there may be a patient responsible charge related to this service. The patient expressed understanding and agreed to proceed.   I discussed the assessment and treatment plan with the patient. The patient was provided an opportunity to ask questions and all were answered. The patient agreed with the plan and demonstrated an understanding of the instructions.   The patient was advised to call back or seek an in-person evaluation if the symptoms worsen or if the condition fails to improve as anticipated.  I provided 15 minutes of non-face-to-face time during this encounter.  The patient was located at home.  The provider was located at [facility].   Angeline LOISE Sayers, NP   Subjective:   Patient ID:  Jenny Harrell is a 23 y.o. (DOB 09-07-2000) female.  Chief Complaint: No chief complaint on file.   HPI Jenny Harrell presents for follow-up of ADHD, GAD and MDD.  Describes mood today as ok. Pleasant. Denies tearfulness. Mood symptoms - denies depression, anxiety and irritability. Reports stable interest and motivation. Denies panic attacks. Denies worry, rumination, and over thinking. Reports mood is stable. Stating I feel like I'm doing ok. Feels like current medication regimen works well. Taking medications as prescribed.  Energy levels stable. Active, does not have a regular exercise routine. Enjoys some usual interests and activities. Student. Lives with boyfriend. Spending time with family and friends. Appetite adequate.  Weight stable - 140 to 150 pounds. Sleeps well most nights. Averages 6 to 8 hours. Focus and concentration stable. Would like to restart a low dose of Ritalin  with being back in school. Attends [employer] - massage therapy. Completing tasks. Managing aspects of household. Works at [employer]. Denies SI or HI.  Denies AH or VH. Denies self harm. Denies substance use.   Previous medication trials: Denies   Review of Systems:  Review of Systems  Musculoskeletal:  Negative for gait problem.  Neurological:  Negative for tremors.  Psychiatric/Behavioral:         Please refer to HPI    Medications: I have reviewed the patient's current medications.  Current Outpatient Medications  Medication Sig Dispense Refill   ibuprofen  (ADVIL ) 600 MG tablet Take 1 tablet (600 mg total) by mouth every 6 (six) hours as needed. 30 tablet 0   medroxyPROGESTERone (DEPO-PROVERA) 150 MG/ML injection Inject into the muscle as directed.     methylphenidate  (RITALIN ) 10 MG tablet Take 1 tablet (10 mg total) by mouth daily. 30 tablet 0   [START ON 03/02/2024] methylphenidate  (RITALIN ) 10 MG tablet Take 1 tablet (10 mg total) by mouth daily. 30 tablet 0   venlafaxine  XR (EFFEXOR -XR) 75 MG 24 hr capsule TAKE THREE CAPSULES BY MOUTH EVERY MORNING 90 capsule 2   No current facility-administered medications for this visit.    Medication Side Effects: None  Allergies: No Known Allergies  Past Medical History:  Diagnosis Date   ADD (attention deficit disorder)    Anxiety    Depression     Family History  Problem Relation Age of Onset   Skin cancer Maternal Grandmother  Lung cancer Maternal Grandfather    Brain cancer Maternal Grandfather    Stroke Paternal Grandmother    Heart attack Paternal Grandmother     Social History   Socioeconomic History   Marital status: Single    Spouse name: Not on file   Number of children: Not on file   Years of education: Not on file   Highest education level: Not  on file  Occupational History   Not on file  Tobacco Use   Smoking status: Never   Smokeless tobacco: Never  Vaping Use   Vaping status: Every Day  Substance and Sexual Activity   Alcohol use: Yes    Comment: occ   Drug use: Not Currently   Sexual activity: Yes    Partners: Male    Birth control/protection: Injection, Condom  Other Topics Concern   Not on file  Social History Narrative   Not on file   Social Drivers of Health   Financial Resource Strain: Not on file  Food Insecurity: Not on file  Transportation Needs: Not on file  Physical Activity: Not on file  Stress: Not on file  Social Connections: Not on file  Intimate Partner Violence: Not on file    Past Medical History, Surgical history, Social history, and Family history were reviewed and updated as appropriate.   Please see review of systems for further details on the patient's review from today.   Objective:   Physical Exam:  There were no vitals taken for this visit.  Physical Exam Constitutional:      General: She is not in acute distress. Musculoskeletal:        General: No deformity.  Neurological:     Mental Status: She is alert and oriented to person, place, and time.     Coordination: Coordination normal.  Psychiatric:        Attention and Perception: Attention and perception normal. She does not perceive auditory or visual hallucinations.        Mood and Affect: Mood normal. Mood is not anxious or depressed. Affect is not labile, blunt, angry or inappropriate.        Speech: Speech normal.        Behavior: Behavior normal.        Thought Content: Thought content normal. Thought content is not paranoid or delusional. Thought content does not include homicidal or suicidal ideation. Thought content does not include homicidal or suicidal plan.        Cognition and Memory: Cognition and memory normal.        Judgment: Judgment normal.     Comments: Insight intact     Lab Review:      Component Value Date/Time   NA 136 05/24/2018 1953   K 3.7 05/24/2018 1953   CL 105 05/24/2018 1953   CO2 25 05/24/2018 1953   GLUCOSE 89 05/24/2018 1953   BUN 14 05/24/2018 1953   CREATININE 0.70 05/24/2018 1953   CALCIUM 9.4 05/24/2018 1953   GFRNONAA NOT CALCULATED 05/24/2018 1953   GFRAA NOT CALCULATED 05/24/2018 1953       Component Value Date/Time   WBC 7.3 05/24/2018 1953   RBC 4.26 05/24/2018 1953   HGB 12.4 05/24/2018 1953   HCT 38.6 05/24/2018 1953   PLT 285 05/24/2018 1953   MCV 90.6 05/24/2018 1953   MCH 29.1 05/24/2018 1953   MCHC 32.1 05/24/2018 1953   RDW 13.0 05/24/2018 1953    No results found for: POCLITH, LITHIUM   No  results found for: PHENYTOIN, PHENOBARB, VALPROATE, CBMZ   .res Assessment: Plan:    Plan:  PDMP reviewed  Effexor  XR 225mg  every morning  Add Ritalin  10mg  daily - has taken previously - enrolled in school  RTC 3 months   15 minutes spent dedicated to the care of this patient on the date of this encounter to include pre-visit review of records, ordering of medication, post visit documentation, and face-to-face time with the patient discussing ADHD, GAD and MDD. Discussed continuing current medication regimen.   Patient advised to contact office with any questions, adverse effects, or acute worsening in signs and symptoms.  Discussed potential benefits, risks, and side effects of stimulants with patient to include increased heart rate, palpitations, insomnia, increased anxiety, increased irritability, or decreased appetite.  Instructed patient to contact office if experiencing any significant tolerability issues.  Diagnoses and all orders for this visit:  Major depression single episode, in partial remission (HCC) -     venlafaxine  XR (EFFEXOR -XR) 75 MG 24 hr capsule; TAKE THREE CAPSULES BY MOUTH EVERY MORNING  Attention deficit hyperactivity disorder (ADHD), combined type, moderate -     venlafaxine  XR (EFFEXOR -XR)  75 MG 24 hr capsule; TAKE THREE CAPSULES BY MOUTH EVERY MORNING -     methylphenidate  (RITALIN ) 10 MG tablet; Take 1 tablet (10 mg total) by mouth daily. -     methylphenidate  (RITALIN ) 10 MG tablet; Take 1 tablet (10 mg total) by mouth daily.  GAD (generalized anxiety disorder) -     venlafaxine  XR (EFFEXOR -XR) 75 MG 24 hr capsule; TAKE THREE CAPSULES BY MOUTH EVERY MORNING    Please see After Visit Summary for patient specific instructions.  No future appointments.   No orders of the defined types were placed in this encounter.     -------------------------------

## 2024-05-04 ENCOUNTER — Encounter: Payer: Self-pay | Admitting: Adult Health

## 2024-05-04 ENCOUNTER — Telehealth: Admitting: Adult Health

## 2024-05-04 DIAGNOSIS — F329 Major depressive disorder, single episode, unspecified: Secondary | ICD-10-CM | POA: Diagnosis not present

## 2024-05-04 DIAGNOSIS — F411 Generalized anxiety disorder: Secondary | ICD-10-CM

## 2024-05-04 DIAGNOSIS — F902 Attention-deficit hyperactivity disorder, combined type: Secondary | ICD-10-CM

## 2024-05-04 DIAGNOSIS — F324 Major depressive disorder, single episode, in partial remission: Secondary | ICD-10-CM

## 2024-05-04 DIAGNOSIS — F909 Attention-deficit hyperactivity disorder, unspecified type: Secondary | ICD-10-CM | POA: Diagnosis not present

## 2024-05-04 MED ORDER — METHYLPHENIDATE HCL 10 MG PO TABS: 10.0000 mg | ORAL_TABLET | Freq: Every day | ORAL | 0 refills | Status: AC

## 2024-05-04 MED ORDER — VENLAFAXINE HCL ER 75 MG PO CP24: ORAL_CAPSULE | ORAL | 2 refills | Status: AC

## 2024-05-04 NOTE — Progress Notes (Signed)
 Jenny Harrell [id number] 25-May-2000 23 y.o.  Virtual Visit via Video Note  I connected with pt @ on 05/04/2024 at 10:00 AM EST by a video enabled telemedicine application and verified that I am speaking with the correct person using two identifiers.   I discussed the limitations of evaluation and management by telemedicine and the availability of in person appointments. The patient expressed understanding and agreed to proceed.  I discussed the assessment and treatment plan with the patient. The patient was provided an opportunity to ask questions and all were answered. The patient agreed with the plan and demonstrated an understanding of the instructions.   The patient was advised to call back or seek an in-person evaluation if the symptoms worsen or if the condition fails to improve as anticipated.  I provided 15 minutes of non-face-to-face time during this encounter.  The patient was located at home.  The provider was located at [facility].   Angeline LOISE Sayers, NP   Subjective:   Patient ID:  Jenny Harrell is a 23 y.o. (DOB [birth year]) female.  Chief Complaint: No chief complaint on file.   HPI Jenny Harrell presents for follow-up of ADHD, GAD and MDD.  Describes mood today as ok. Pleasant. Denies tearfulness. Mood symptoms - denies depression, anxiety and irritability. Reports stable interest and motivation. Denies panic attacks. Denies worry, rumination, and over thinking. Reports mood is stable. Stating I feel like I'm doing pretty good. Feels like current medication regimen works well. Taking medications as prescribed.  Energy levels stable. Active, does not have a regular exercise routine. Enjoys some usual interests and activities. Student. Lives with boyfriend. Spending time with family and friends. Appetite adequate. Weight stable - 140 to 150 pounds. Sleeps well most nights. Averages 6 to 8 hours. Focus and concentration stable with  restarting Ritalin . Completing tasks. Attends [employer] - massage therapy. Managing aspects of household. Works at [employer]. Denies SI or HI.  Denies AH or VH. Denies self harm. Denies substance use.   Previous medication trials: Denies    Review of Systems:  Review of Systems  Medications: I have reviewed the patient's current medications.  Current Outpatient Medications  Medication Sig Dispense Refill   ibuprofen  (ADVIL ) 600 MG tablet Take 1 tablet (600 mg total) by mouth every 6 (six) hours as needed. 30 tablet 0   medroxyPROGESTERone (DEPO-PROVERA) 150 MG/ML injection Inject into the muscle as directed.     methylphenidate  (RITALIN ) 10 MG tablet Take 1 tablet (10 mg total) by mouth daily. 30 tablet 0   methylphenidate  (RITALIN ) 10 MG tablet Take 1 tablet (10 mg total) by mouth daily. 30 tablet 0   venlafaxine  XR (EFFEXOR -XR) 75 MG 24 hr capsule TAKE THREE CAPSULES BY MOUTH EVERY MORNING 90 capsule 2   No current facility-administered medications for this visit.    Medication Side Effects: None  Allergies: Allergies[1]  Past Medical History:  Diagnosis Date   ADD (attention deficit disorder)    Anxiety    Depression     Family History  Problem Relation Age of Onset   Skin cancer Maternal Grandmother    Lung cancer Maternal Grandfather    Brain cancer Maternal Grandfather    Stroke Paternal Grandmother    Heart attack Paternal Grandmother     Social History   Socioeconomic History   Marital status: Single    Spouse name: Not on file   Number of children: Not on file   Years of education: Not on file  Highest education level: Not on file  Occupational History   Not on file  Tobacco Use   Smoking status: Never   Smokeless tobacco: Never  Vaping Use   Vaping status: Every Day  Substance and Sexual Activity   Alcohol use: Yes    Comment: occ   Drug use: Not Currently   Sexual activity: Yes    Partners: Male    Birth control/protection: Injection,  Condom  Other Topics Concern   Not on file  Social History Narrative   Not on file   Social Drivers of Health   Tobacco Use: Low Risk (02/03/2024)   Patient History    Smoking Tobacco Use: Never    Smokeless Tobacco Use: Never    Passive Exposure: Not on file  Financial Resource Strain: Not on file  Food Insecurity: Not on file  Transportation Needs: Not on file  Physical Activity: Not on file  Stress: Not on file  Social Connections: Not on file  Intimate Partner Violence: Not on file  Depression (EYV7-0): Not on file  Alcohol Screen: Not on file  Housing: Not on file  Utilities: Not on file  Health Literacy: Not on file    Past Medical History, Surgical history, Social history, and Family history were reviewed and updated as appropriate.   Please see review of systems for further details on the patient's review from today.   Objective:   Physical Exam:  There were no vitals taken for this visit.  Physical Exam Constitutional:      General: She is not in acute distress. Musculoskeletal:        General: No deformity.  Neurological:     Mental Status: She is alert and oriented to person, place, and time.     Coordination: Coordination normal.  Psychiatric:        Attention and Perception: Attention and perception normal. She does not perceive auditory or visual hallucinations.        Mood and Affect: Mood normal. Mood is not anxious or depressed. Affect is not labile, blunt, angry or inappropriate.        Speech: Speech normal.        Behavior: Behavior normal.        Thought Content: Thought content normal. Thought content is not paranoid or delusional. Thought content does not include homicidal or suicidal ideation. Thought content does not include homicidal or suicidal plan.        Cognition and Memory: Cognition and memory normal.        Judgment: Judgment normal.     Comments: Insight intact     Lab Review:     Component Value Date/Time   NA 136  05/24/2018 1953   K 3.7 05/24/2018 1953   CL 105 05/24/2018 1953   CO2 25 05/24/2018 1953   GLUCOSE 89 05/24/2018 1953   BUN 14 05/24/2018 1953   CREATININE 0.70 05/24/2018 1953   CALCIUM 9.4 05/24/2018 1953   GFRNONAA NOT CALCULATED 05/24/2018 1953   GFRAA NOT CALCULATED 05/24/2018 1953       Component Value Date/Time   WBC 7.3 05/24/2018 1953   RBC 4.26 05/24/2018 1953   HGB 12.4 05/24/2018 1953   HCT 38.6 05/24/2018 1953   PLT 285 05/24/2018 1953   MCV 90.6 05/24/2018 1953   MCH 29.1 05/24/2018 1953   MCHC 32.1 05/24/2018 1953   RDW 13.0 05/24/2018 1953    No results found for: POCLITH, LITHIUM   No results found for: PHENYTOIN, PHENOBARB,  VALPROATE, CBMZ   .res Assessment: Plan:    Plan:  PDMP reviewed  Effexor  XR 225mg  every morning  Ritalin  10mg  daily - has taken previously - enrolled in school  RTC 3 months   15 minutes spent dedicated to the care of this patient on the date of this encounter to include pre-visit review of records, ordering of medication, post visit documentation, and face-to-face time with the patient discussing ADHD, GAD and MDD. Discussed continuing current medication regimen.   Patient advised to contact office with any questions, adverse effects, or acute worsening in signs and symptoms.  Discussed potential benefits, risks, and side effects of stimulants with patient to include increased heart rate, palpitations, insomnia, increased anxiety, increased irritability, or decreased appetite.  Instructed patient to contact office if experiencing any significant tolerability issues.  There are no diagnoses linked to this encounter.   Please see After Visit Summary for patient specific instructions.  Future Appointments  Date Time Provider Department Center  05/04/2024 10:00 AM Koston Hennes Nattalie, NP [facility] None    No orders of the defined types were placed in this encounter.     -------------------------------      [1] No Known Allergies
